# Patient Record
Sex: Male | Born: 1975 | Race: White | Hispanic: No | Marital: Married | Smoking: Never smoker
Health system: Southern US, Community
[De-identification: ages and names within clinical notes are randomized; demographics above are authoritative.]

## PROBLEM LIST (undated history)

## (undated) DIAGNOSIS — J45909 Unspecified asthma, uncomplicated: Secondary | ICD-10-CM

---

## 1999-10-31 ENCOUNTER — Ambulatory Visit (HOSPITAL_COMMUNITY): Admission: RE | Admit: 1999-10-31 | Discharge: 1999-10-31 | Payer: Self-pay | Admitting: Specialist

## 1999-10-31 ENCOUNTER — Encounter: Payer: Self-pay | Admitting: Specialist

## 2003-10-25 ENCOUNTER — Emergency Department (HOSPITAL_COMMUNITY): Admission: EM | Admit: 2003-10-25 | Discharge: 2003-10-25 | Payer: Self-pay | Admitting: Emergency Medicine

## 2005-08-20 ENCOUNTER — Ambulatory Visit: Payer: Self-pay | Admitting: Gastroenterology

## 2005-09-06 ENCOUNTER — Ambulatory Visit: Payer: Self-pay | Admitting: Gastroenterology
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
## 2005-10-29 ENCOUNTER — Ambulatory Visit: Payer: Self-pay | Admitting: Gastroenterology

## 2010-05-29 ENCOUNTER — Encounter (INDEPENDENT_AMBULATORY_CARE_PROVIDER_SITE_OTHER): Payer: Self-pay | Admitting: *Deleted

## 2010-06-05 ENCOUNTER — Encounter (INDEPENDENT_AMBULATORY_CARE_PROVIDER_SITE_OTHER): Payer: Self-pay | Admitting: *Deleted

## 2010-06-16 ENCOUNTER — Encounter (INDEPENDENT_AMBULATORY_CARE_PROVIDER_SITE_OTHER): Payer: Self-pay | Admitting: *Deleted

## 2010-06-30 ENCOUNTER — Encounter (INDEPENDENT_AMBULATORY_CARE_PROVIDER_SITE_OTHER): Payer: Self-pay | Admitting: *Deleted

## 2010-07-05 ENCOUNTER — Ambulatory Visit: Payer: Self-pay | Admitting: Gastroenterology

## 2010-08-25 ENCOUNTER — Ambulatory Visit: Payer: Self-pay | Admitting: Gastroenterology

## 2010-10-03 NOTE — Letter (Signed)
Summary: Pre Visit Letter Revised  [facility]  [address] [place], [place] [address]   Phone: [phone]  Fax: [phone]        06/05/2010 MRN: [id number] [facility] [address] [place], [place]  [address]             Procedure Date:  07-19-10   Welcome to the Gastroenterology Division at [facility].    You are scheduled to see a nurse for your pre-procedure visit on 07-05-10 at 8:00a.m. on the 3rd floor at [facility], [address]  We ask that you try to arrive at our office 15 minutes prior to your appointment time to allow for check-in.  Please take a minute to review the attached form.  If you answer "Yes" to one or more of the questions on the first page, we ask that you call the person listed at your earliest opportunity.  If you answer "No" to all of the questions, please complete the rest of the form and bring it to your appointment.    Your nurse visit will consist of discussing your medical and surgical history, your immediate family medical history, and your medications.   If you are unable to list all of your medications on the form, please bring the medication bottles to your appointment and we will list them.  We will need to be aware of both prescribed and over the counter drugs.  We will need to know exact dosage information as well.    Please be prepared to read and sign documents such as consent forms, a financial agreement, and acknowledgement forms.  If necessary, and with your consent, a friend or relative is welcome to sit-in on the nurse visit with you.  Please bring your insurance card so that we may make a copy of it.  If your insurance requires a referral to see a specialist, please bring your referral form from your primary care physician.  No co-pay is required for this nurse visit.     If you cannot keep your appointment, please call [phone] to cancel or reschedule prior to your appointment date.  This  allows [place] the opportunity to schedule an appointment for another patient in need of care.    Thank you for choosing [facility] for your medical needs.  We appreciate the opportunity to care for you.  Please visit [place] at our website  to learn more about our practice.  Sincerely, The Gastroenterology Division

## 2010-10-03 NOTE — Letter (Signed)
Summary: New Patient letter  [facility]  [address] [place], [place] [address]   Phone: [phone]  Fax: [phone]       05/29/2010 MRN: [id number]  [facility] [address] [facility] [address] [place], [place]  [address]  Dear [name],  Welcome to the Gastroenterology Division at [facility].    You are scheduled to see Dr.  Christella Hartigan on 07-11-10 at 2:30p.m. on the 3rd floor at [facility], [address].  We ask that you try to arrive at our office 15 minutes prior to your appointment time to allow for check-in.  We would like you to complete the enclosed self-administered evaluation form prior to your visit and bring it with you on the day of your appointment.  We will review it with you.  Also, please bring a complete list of all your medications or, if you prefer, bring the medication bottles and we will list them.  Please bring your insurance card so that we may make a copy of it.  If your insurance requires a referral to see a specialist, please bring your referral form from your primary care physician.  Co-payments are due at the time of your visit and may be paid by cash, check or credit card.     Your office visit will consist of a consult with your physician (includes a physical exam), any laboratory testing he/she may order, scheduling of any necessary diagnostic testing (e.g. x-ray, ultrasound, CT-scan), and scheduling of a procedure (e.g. Endoscopy, Colonoscopy) if required.  Please allow enough time on your schedule to allow for any/all of these possibilities.    If you cannot keep your appointment, please call [phone] to cancel or reschedule prior to your appointment date.  This allows [place] the opportunity to schedule an appointment for another patient in need of care.  If you do not cancel or reschedule by 5 p.m. the business day prior to your appointment date, you will be charged a $50.00 late cancellation/no-show fee.    Thank you for  choosing [facility] for your medical needs.  We appreciate the opportunity to care for you.  Please visit [place] at our website  to learn more about our practice.                     Sincerely,                                                             The Gastroenterology Division

## 2010-10-03 NOTE — Miscellaneous (Signed)
Summary: [facility] PREVISIT/PREP  Clinical Lists Changes  Medications: Added new medication of MOVIPREP 100 GM  SOLR (PEG-KCL-NACL-NASULF-NA ASC-C) As per prep instructions. - Signed Rx of MOVIPREP 100 GM  SOLR (PEG-KCL-NACL-NASULF-NA ASC-C) As per prep instructions.;  #1 x 0;  Signed;  Entered by: Wyona Almas RN;  Authorized by: Rachael Fee MD;  Method used: Electronically to [facility] [address] [place], [place], [place]  [address], Ph: [phone], Fax: [phone] Observations: Added new observation of NKA: T (07/05/2010 8:03)    Prescriptions: MOVIPREP 100 GM  SOLR (PEG-KCL-NACL-NASULF-NA ASC-C) As per prep instructions.  #1 x 0   Entered by:   Wyona Almas RN   Authorized by:   Rachael Fee MD   Signed by:   Wyona Almas RN on 07/05/2010   Method used:   Electronically to        [facility]* (retail)       [address] [place], [place]  [address]       Ph: [phone]       Fax: [phone]   RxID:   [id number]

## 2010-10-03 NOTE — Letter (Signed)
Summary: Pre Visit Letter Revised  [facility]  [address] [place], [place] [address]   Phone: [phone]  Fax: [phone]        06/16/2010 MRN: [id number] Anthony Roth [address] [place], [place] [address]             Procedure Date:  07-19-10   Welcome to the Gastroenterology Division at [facility].    You are scheduled to see a nurse for your pre-procedure visit on 07-05-10 at 8:00a.m. on the 3rd floor at [facility], [address].  We ask that you try to arrive at our office 15 minutes prior to your appointment time to allow for check-in.  Please take a minute to review the attached form.  If you answer "Yes" to one or more of the questions on the first page, we ask that you call the person listed at your earliest opportunity.  If you answer "No" to all of the questions, please complete the rest of the form and bring it to your appointment.    Your nurse visit will consist of discussing your medical and surgical history, your immediate family medical history, and your medications.   If you are unable to list all of your medications on the form, please bring the medication bottles to your appointment and we will list them.  We will need to be aware of both prescribed and over the counter drugs.  We will need to know exact dosage information as well.    Please be prepared to read and sign documents such as consent forms, a financial agreement, and acknowledgement forms.  If necessary, and with your consent, a friend or relative is welcome to sit-in on the nurse visit with you.  Please bring your insurance card so that we may make a copy of it.  If your insurance requires a referral to see a specialist, please bring your referral form from your primary care physician.  No co-pay is required for this nurse visit.     If you cannot keep your appointment, please call [phone] to cancel or reschedule prior to your appointment date.  This  allows [place] the opportunity to schedule an appointment for another patient in need of care.    Thank you for choosing [facility] for your medical needs.  We appreciate the opportunity to care for you.  Please visit [place] at our website  to learn more about our practice.  Sincerely, The Gastroenterology Division

## 2010-10-03 NOTE — Letter (Signed)
Summary: Moviprep Instructions  [facility]  [address]   [place], [place] [address]   Phone: [phone]  Fax: [phone]       Anthony Roth    11-30-1975    MRN: [id number]        Procedure Day Dorna Bloom: Wednesday, 07-19-10     Arrival Time: 10:30 a.m.     Procedure Time: 11:30 a.m.     Location of Procedure:                    x  [facility] (4th Floor)   PREPARATION FOR COLONOSCOPY WITH MOVIPREP   Starting 5 days prior to your procedure 07-19-10 do not eat: nuts, seeds, popcorn, corn, beans, peas, salads, or any raw vegetables.  Do not take any fiber supplements (e.g. Metamucil, Citrucel, and Benefiber).  THE DAY BEFORE YOUR PROCEDURE         DATE: 07-18-10 DAY: Tuesday  1.  Drink clear liquids the entire day-NO SOLID FOOD  2.  Do not drink anything colored red or purple.  Avoid juices with pulp.  No orange juice.  3.  Drink at least 64 oz. (8 glasses) of fluid/clear liquids during the day to prevent dehydration and help the prep work efficiently.  CLEAR LIQUIDS INCLUDE: Water Jello Ice Popsicles Tea (sugar ok, no milk/cream) Powdered fruit flavored drinks Coffee (sugar ok, no milk/cream) Gatorade Juice: apple, white grape, white cranberry  Lemonade Clear bullion, consomm, broth Carbonated beverages (any kind) Strained chicken noodle soup Hard Candy                             4.  In the morning, mix first dose of MoviPrep solution:    Empty 1 Pouch A and 1 Pouch B into the disposable container    Add lukewarm drinking water to the top line of the container. Mix to dissolve    Refrigerate (mixed solution should be used within 24 hrs)  5.  Begin drinking the prep at 5:00 p.m. The MoviPrep container is divided by 4 marks.   Every 15 minutes drink the solution down to the next mark (approximately 8 oz) until the full liter is complete.   6.  Follow completed prep with 16 oz of clear liquid of your choice (Nothing red or  purple).  Continue to drink clear liquids until bedtime.  7.  Before going to bed, mix second dose of MoviPrep solution:    Empty 1 Pouch A and 1 Pouch B into the disposable container    Add lukewarm drinking water to the top line of the container. Mix to dissolve    Refrigerate  THE DAY OF YOUR PROCEDURE      DATE: 07-19-10 DAY: Wednesday  Beginning at 6:30 a.m. (5 hours before procedure):         1. Every 15 minutes, drink the solution down to the next mark (approx 8 oz) until the full liter is complete.  2. Follow completed prep with 16 oz. of clear liquid of your choice.    3. You may drink clear liquids until  9:30 a.m. (2 hours before procedure).   MEDICATION INSTRUCTIONS  Unless otherwise instructed, you should take regular prescription medications with a small sip of water   as early as possible the morning of your procedure.       OTHER INSTRUCTIONS  You will need a responsible adult at least 34 years of age  to accompany you and drive you home.   This person must remain in the waiting room during your procedure.  Wear loose fitting clothing that is easily removed.  Leave jewelry and other valuables at home.  However, you may wish to bring a book to read or  an iPod/MP3 player to listen to music as you wait for your procedure to start.  Remove all body piercing jewelry and leave at home.  Total time from sign-in until discharge is approximately 2-3 hours.  You should go home directly after your procedure and rest.  You can resume normal activities the  day after your procedure.  The day of your procedure you should not:   Drive   Make legal decisions   Operate machinery   Drink alcohol   Return to work  You will receive specific instructions about eating, activities and medications before you leave.    The above instructions have been reviewed and explained to me by   Wyona Almas RN  July 05, 2010 8:24 AM     I fully understand and can  verbalize these instructions _____________________________ Date _________

## 2010-10-05 NOTE — Procedures (Signed)
Summary: Colonoscopy  Patient: Anthony Roth Note: All result statuses are Final unless otherwise noted.  Tests: (1) Colonoscopy (COL)   COL Colonoscopy           DONE     [facility]     [address]     [place], [place]  [address]           COLONOSCOPY PROCEDURE REPORT           PATIENT:  Anthony Roth, Anthony Roth  MR#:  [id number]     BIRTHDATE:  April 10, 1976, 34 yrs. old  GENDER:  male     ENDOSCOPIST:  Rachael Fee, MD     PROCEDURE DATE:  08/25/2010     PROCEDURE:  Diagnostic Colonoscopy     ASA CLASS:  Class I     INDICATIONS:  Elevated Risk Screening, sibling with adenomatous     colon polyps in his 30's     MEDICATIONS:   Fentanyl 75 mcg IV, Versed 8 mg IV           DESCRIPTION OF PROCEDURE:   After the risks benefits and     alternatives of the procedure were thoroughly explained, informed     consent was obtained.  Digital rectal exam was performed and     revealed no rectal masses.   The LB [id number]-[id number] endoscope     was introduced through the anus and advanced to the cecum, which     was identified by both the appendix and ileocecal valve, without     limitations.  The quality of the prep was good, using MoviPrep.     The instrument was then slowly withdrawn as the colon was fully     examined.     <<PROCEDUREIMAGES>>     FINDINGS:  Mild diverticulosis was found in the sigmoid to     descending colon segments (see image4).  This was otherwise a     normal examination of the colon (see image1, image2, and image5).     Retroflexed views in the rectum revealed no abnormalities.    The     scope was then withdrawn from the patient and the procedure     completed.     COMPLICATIONS:  None           ENDOSCOPIC IMPRESSION:     1) Mild diverticulosis in the sigmoid to descending colon     segments     2) Otherwise normal examination; no polyps or cancers           RECOMMENDATIONS:     1) Given your significant family history of colon polyps, you  should have a repeat colonoscopy in 5 years           REPEAT EXAM:  5 years           ______________________________     Rachael Fee, MD           n.     eSIGNED:   Rachael Fee at 08/25/2010 11:08 AM           Milas Kocher, [id number]  Note: An exclamation mark (!) indicates a result that was not dispersed into the flowsheet. Document Creation Date: 08/25/2010 11:09 AM _______________________________________________________________________  (1) Order result status: Final Collection or observation date-time: 08/25/2010 11:05 Requested date-time:  Receipt date-time:  Reported date-time:  Referring Physician:   Ordering Physician: Rob Bunting [phone]) Specimen Source:  Source: Launa Grill Order Number: [id number] Lab site:  Appended Document: Colonoscopy    Clinical Lists Changes  Observations: Added new observation of COLONNXTDUE: 08/26/2015 (08/25/2010 11:46)

## 2011-12-10 ENCOUNTER — Ambulatory Visit (INDEPENDENT_AMBULATORY_CARE_PROVIDER_SITE_OTHER): Payer: BC Managed Care – PPO | Admitting: Physician Assistant

## 2011-12-10 VITALS — BP 96/61 | HR 96 | Temp 98.9°F | Resp 16 | Ht 69.5 in | Wt 165.0 lb

## 2011-12-10 DIAGNOSIS — R509 Fever, unspecified: Secondary | ICD-10-CM

## 2011-12-10 DIAGNOSIS — J111 Influenza due to unidentified influenza virus with other respiratory manifestations: Secondary | ICD-10-CM

## 2011-12-10 LAB — POCT INFLUENZA A/B
Influenza A, POC: NEGATIVE
Influenza B, POC: POSITIVE

## 2011-12-10 MED ORDER — IPRATROPIUM BROMIDE 0.06 % NA SOLN: 2.0000 | Freq: Four times a day (QID) | NASAL | Status: AC

## 2011-12-10 MED ORDER — HYDROCODONE-HOMATROPINE 5-1.5 MG/5ML PO SYRP: 5.0000 mL | ORAL_SOLUTION | ORAL | Status: AC | PRN

## 2011-12-10 MED ORDER — OSELTAMIVIR PHOSPHATE 75 MG PO CAPS: 75.0000 mg | ORAL_CAPSULE | Freq: Two times a day (BID) | ORAL | Status: AC

## 2011-12-10 NOTE — Patient Instructions (Signed)
Influenza Facts Flu (influenza) is a contagious respiratory illness caused by the influenza viruses. It can cause mild to severe illness. While most healthy people recover from the flu without specific treatment and without complications, older people, young children, and people with certain health conditions are at higher risk for serious complications from the flu, including death. CAUSES   The flu virus is spread from person to person by respiratory droplets from coughing and sneezing.   A person can also become infected by touching an object or surface with a virus on it and then touching their mouth, eye or nose.   Adults may be able to infect others from 1 day before symptoms occur and up to 7 days after getting sick. So it is possible to give someone the flu even before you know you are sick and continue to infect others while you are sick.  SYMPTOMS   Fever (usually high).   Headache.   Tiredness (can be extreme).   Cough.   Sore throat.   Runny or stuffy nose.   Body aches.   Diarrhea and vomiting may also occur, particularly in children.   These symptoms are referred to as "flu-like symptoms". A lot of different illnesses, including the common cold, can have similar symptoms.  DIAGNOSIS   There are tests that can determine if you have the flu as long you are tested within the first 2 or 3 days of illness.   A doctor's exam and additional tests may be needed to identify if you have a disease that is a complicating the flu.  RISKS AND COMPLICATIONS  Some of the complications caused by the flu include:  Bacterial pneumonia or progressive pneumonia caused by the flu virus.   Loss of body fluids (dehydration).   Worsening of chronic medical conditions, such as heart failure, asthma, or diabetes.   Sinus problems and ear infections.  HOME CARE INSTRUCTIONS   Seek medical care early on.   If you are at high risk from complications of the flu, consult your health-care  provider as soon as you develop flu-like symptoms. Those at high risk for complications include:   People 65 years or older.   People with chronic medical conditions, including diabetes.   Pregnant women.   Young children.   Your caregiver may recommend use of an antiviral medication to help treat the flu.   If you get the flu, get plenty of rest, drink a lot of liquids, and avoid using alcohol and tobacco.   You can take over-the-counter medications to relieve the symptoms of the flu if your caregiver approves. (Never give aspirin to children or teenagers who have flu-like symptoms, particularly fever).  PREVENTION  The single best way to prevent the flu is to get a flu vaccine each fall. Other measures that can help protect against the flu are:  Antiviral Medications   A number of antiviral drugs are approved for use in preventing the flu. These are prescription medications, and a doctor should be consulted before they are used.   Habits for Good Health   Cover your nose and mouth with a tissue when you cough or sneeze, throw the tissue away after you use it.   Wash your hands often with soap and water, especially after you cough or sneeze. If you are not near water, use an alcohol-based hand cleaner.   Avoid people who are sick.   If you get the flu, stay home from work or school. Avoid contact with   other people so that you do not make them sick, too.   Try not to touch your eyes, nose, or mouth as germs ore often spread this way.  IN CHILDREN, EMERGENCY WARNING SIGNS THAT NEED URGENT MEDICAL ATTENTION:  Fast breathing or trouble breathing.   Bluish skin color.   Not drinking enough fluids.   Not waking up or not interacting.   Being so irritable that the child does not want to be held.   Flu-like symptoms improve but then return with fever and worse cough.   Fever with a rash.  IN ADULTS, EMERGENCY WARNING SIGNS THAT NEED URGENT MEDICAL ATTENTION:  Difficulty  breathing or shortness of breath.   Pain or pressure in the chest or abdomen.   Sudden dizziness.   Confusion.   Severe or persistent vomiting.  SEEK IMMEDIATE MEDICAL CARE IF:  You or someone you know is experiencing any of the symptoms above. When you arrive at the emergency center,report that you think you have the flu. You may be asked to wear a mask and/or sit in a secluded area to protect others from getting sick. MAKE SURE YOU:   Understand these instructions.   Monitor your condition.   Seek medical care if you are getting worse, or not improving.  Document Released: 08/23/2003 Document Revised: 08/09/2011 Document Reviewed: 05/19/2009 Exit[employer] Patient Information 2012 [employer]. 

## 2011-12-10 NOTE — Progress Notes (Signed)
  Subjective:    Patient ID: Anthony Roth, male    DOB: 03-Oct-1975, 36 y.o.   MRN: [id number]  HPI Masato comes in tonight with his wife.  He is c/o scratchy throat and feeling like something was coming on 3 days ago but worsened last pm with myalgias, cough, congestion and PND.  Went to work today and still got worse with 103 fever.  No influenza vaccine. Non smoker.   Had sinus illness with high fever last month that ended with significant cough.   Review of Systems As above    Objective:   Physical Exam  Constitutional: He appears well-developed and well-nourished. He has a sickly appearance.  HENT:  Right Ear: Tympanic membrane normal.  Left Ear: Tympanic membrane normal.  Nose: Mucosal edema and rhinorrhea present.  Mouth/Throat: Posterior oropharyngeal erythema present.  Cardiovascular: Normal rate and regular rhythm.   Pulmonary/Chest: Effort normal and breath sounds normal.  Lymphadenopathy:    He has no cervical adenopathy.          Results for orders placed in visit on 12/10/11  POCT INFLUENZA A/B      Component Value Range   Influenza A, POC Negative     Influenza B, POC Positive      Assessment & Plan:  Influenza B  Tamiflu, Hycodan, Atrovent NS, mucinex D, push fluids, Advil every 6-8.  OOW thru 12/14/11.  Call if symptoms worsen.

## 2015-01-14 ENCOUNTER — Encounter: Payer: Self-pay | Admitting: Gastroenterology

## 2015-07-07 ENCOUNTER — Encounter: Payer: Self-pay | Admitting: Gastroenterology

## 2022-07-28 ENCOUNTER — Other Ambulatory Visit: Payer: Self-pay

## 2022-07-28 ENCOUNTER — Emergency Department (HOSPITAL_BASED_OUTPATIENT_CLINIC_OR_DEPARTMENT_OTHER)
Admission: EM | Admit: 2022-07-28 | Discharge: 2022-07-28 | Disposition: A | Discharge status: Home / Self Care | Payer: BC Managed Care – PPO | Attending: Emergency Medicine | Admitting: Emergency Medicine

## 2022-07-28 ENCOUNTER — Emergency Department (HOSPITAL_BASED_OUTPATIENT_CLINIC_OR_DEPARTMENT_OTHER): Payer: BC Managed Care – PPO

## 2022-07-28 ENCOUNTER — Encounter (HOSPITAL_BASED_OUTPATIENT_CLINIC_OR_DEPARTMENT_OTHER): Payer: Self-pay | Admitting: Emergency Medicine

## 2022-07-28 DIAGNOSIS — J45909 Unspecified asthma, uncomplicated: Secondary | ICD-10-CM | POA: Insufficient documentation

## 2022-07-28 DIAGNOSIS — R079 Chest pain, unspecified: Secondary | ICD-10-CM

## 2022-07-28 HISTORY — DX: Unspecified asthma, uncomplicated: J45.909

## 2022-07-28 LAB — CBC
HCT: 46.5 % (ref 39.0–52.0)
Hemoglobin: 15.8 g/dL (ref 13.0–17.0)
MCH: 31.7 pg (ref 26.0–34.0)
MCHC: 34 g/dL (ref 30.0–36.0)
MCV: 93.4 fL (ref 80.0–100.0)
Platelets: 275 10*3/uL (ref 150–400)
RBC: 4.98 MIL/uL (ref 4.22–5.81)
RDW: 12.7 % (ref 11.5–15.5)
WBC: 5.8 10*3/uL (ref 4.0–10.5)
nRBC: 0 % (ref 0.0–0.2)

## 2022-07-28 LAB — BASIC METABOLIC PANEL
Anion gap: 6 (ref 5–15)
BUN: 16 mg/dL (ref 6–20)
CO2: 27 mmol/L (ref 22–32)
Calcium: 8.8 mg/dL — ABNORMAL LOW (ref 8.9–10.3)
Chloride: 103 mmol/L (ref 98–111)
Creatinine, Ser: 1.05 mg/dL (ref 0.61–1.24)
GFR, Estimated: >60 mL/min (ref >60)
Glucose, Bld: 101 mg/dL — ABNORMAL HIGH (ref 70–99)
Potassium: 3.6 mmol/L (ref 3.5–5.1)
Sodium: 136 mmol/L (ref 135–145)

## 2022-07-28 LAB — D-DIMER, QUANTITATIVE: D-Dimer, Quant: <0.27 ug/mL-FEU (ref 0.00–0.50)

## 2022-07-28 LAB — TROPONIN I (HIGH SENSITIVITY)
Troponin I (High Sensitivity): <2 ng/L (ref <18)
Troponin I (High Sensitivity): <2 ng/L (ref <18)

## 2022-07-28 NOTE — ED Triage Notes (Signed)
Patient c/o mid chest pain, shortness of breath and palpitations for couple of weeks.

## 2022-07-28 NOTE — Discharge Instructions (Addendum)
It was a pleasure taking care of you today.  As discussed, all of your labs are reassuring.  Your cardiac markers were normal.  I have placed a referral to cardiology. They should call you within the next week to schedule an appointment. Return to the ER for new or worsening symptoms.

## 2022-07-28 NOTE — ED Provider Notes (Signed)
[facility] EMERGENCY DEPARTMENT Provider Note   CSN: [id number] Arrival date & time: 07/28/22  1419     History  Chief Complaint  Patient presents with   Chest Pain    Anthony Roth is a 46 y.o. male with a past medical history significant for asthma who presents to the ED due to intermittent central, nonradiating chest pain that has been present for the past 1 to 2 weeks associated with shortness of breath.  Patient describes chest pain as a pressure-like sensation in the center of his chest.  He admits to shortness of breath with exertion.  Chest pain occurs at rest and with exertion.  No previous cardiac history.  Patient has an extensive family history of cardiac issues some being before the age of 74.  Denies lower extremity edema.  No history of blood clots, recent surgeries, recent long immobilizations, or hormonal treatments.  Patient states he has been significantly stressed over the past few weeks.  No recent illness.  Denies fever and chills.  History obtained from patient and past medical records. No interpreter used during encounter.       Home Medications Prior to Admission medications   Medication Sig Start Date End Date Taking? Authorizing Provider  ipratropium (ATROVENT) 0.06 % nasal spray Place 2 sprays into the nose 4 (four) times daily. 12/10/11 12/09/12  Damaris Hippo, PA-C      Allergies    Patient has no known allergies.    Review of Systems   Review of Systems  Constitutional:  Negative for chills and fever.  Respiratory:  Positive for shortness of breath.   Cardiovascular:  Positive for chest pain. Negative for leg swelling.  Gastrointestinal:  Negative for abdominal pain, diarrhea, nausea and vomiting.  All other systems reviewed and are negative.   Physical Exam Updated Vital Signs BP 124/83   Pulse 65   Temp 99.1 F (37.3 C) (Oral)   Resp 16   Ht 5\' 10"  (1.778 m)   Wt 97.5 kg   SpO2 96%   BMI 30.85 kg/m  Physical  Exam Vitals and nursing note reviewed.  Constitutional:      General: He is not in acute distress.    Appearance: He is not ill-appearing.  HENT:     Head: Normocephalic.  Eyes:     Pupils: Pupils are equal, round, and reactive to light.  Cardiovascular:     Rate and Rhythm: Normal rate and regular rhythm.     Pulses: Normal pulses.     Heart sounds: Normal heart sounds. No murmur heard.    No friction rub. No gallop.  Pulmonary:     Effort: Pulmonary effort is normal.     Breath sounds: Normal breath sounds.  Abdominal:     General: Abdomen is flat. There is no distension.     Palpations: Abdomen is soft.     Tenderness: There is no abdominal tenderness. There is no guarding or rebound.  Musculoskeletal:        General: Normal range of motion.     Cervical back: Neck supple.     Comments: No lower extremity edema.  Skin:    General: Skin is warm and dry.  Neurological:     General: No focal deficit present.     Mental Status: He is alert.  Psychiatric:        Mood and Affect: Mood normal.        Behavior: Behavior normal.     ED  Results / Procedures / Treatments   Labs (all labs ordered are listed, but only abnormal results are displayed) Labs Reviewed  BASIC METABOLIC PANEL - Abnormal; Notable for the following components:      Result Value   Glucose, Bld 101 (*)    Calcium 8.8 (*)    All other components within normal limits  CBC  D-DIMER, QUANTITATIVE  TROPONIN I (HIGH SENSITIVITY)  TROPONIN I (HIGH SENSITIVITY)    EKG None  Radiology No results found.  Procedures Procedures    Medications Ordered in ED Medications - No data to display  ED Course/ Medical Decision Making/ A&P Clinical Course as of 07/28/22 2257  Sat Jul 28, 2022  1837 D-Dimer, Quant: <0.27 [CA]    Clinical Course User Index [CA] Mannie Stabile, PA-C                           Medical Decision Making Amount and/or Complexity of Data Reviewed Independent Historian:  spouse Labs: ordered. Decision-making details documented in ED Course. Radiology: ordered and independent interpretation performed. Decision-making details documented in ED Course. ECG/medicine tests: ordered and independent interpretation performed. Decision-making details documented in ED Course.   This patient presents to the ED for concern of CP, this involves an extensive number of treatment options, and is a complaint that carries with it a high risk of complications and morbidity.  The differential diagnosis includes ACS, PE, dissection, pneumonia, MSK, etc  46 year old male presents to the ED due to intermittent central nonradiating chest pain associate with shortness of breath for the past 1 to 2 weeks.  No cardiac history.  Patient has an extensive family history of cardiac issues.  No history of blood clots.  Upon arrival, stable vitals.  Patient in no acute distress.  Benign physical exam.  No lower extremity edema.  Cardiac labs ordered.  EKG and chest x-ray.  Added D-dimer to rule out PE given shortness of breath.  CBC unremarkable.  No leukocytosis and normal hemoglobin.  Delta troponin flat.  EKG demonstrates normal sinus rhythm.  No signs of acute ischemia.  Low suspicion for ACS.  Dimer normal.  Low suspicion for PE/DVT.  BMP reassuring.  Normal renal function.  No major electrolyte derangements.  Chest x-ray personally reviewed and interpreted which are negative for signs of pneumonia, pneumothorax or widened mediastinum.  Presentation not concerning for dissection.  No infectious symptoms to suggest myocarditis or other noxious etiology.    Upon reassessment, patient remained with very minimal chest pain. No worsening of symptoms while here in the ED. Cardiology referral placed for patient. Strict ED precautions discussed with patient. Patient states understanding and agrees to plan. Patient discharged home in no acute distress and stable vitals.       Final Clinical  Impression(s) / ED Diagnoses Final diagnoses:  Nonspecific chest pain    Rx / DC Orders ED Discharge Orders          Ordered    Ambulatory referral to Cardiology       Comments: If you have not heard from the Cardiology office within the next 72 hours please call [phone].   07/28/22 2250              Mannie Stabile, PA-C 07/28/22 2258    Elayne Snare K, DO 07/28/22 2322

## 2022-08-15 ENCOUNTER — Other Ambulatory Visit (HOSPITAL_COMMUNITY): Payer: Self-pay | Admitting: *Deleted

## 2022-08-15 DIAGNOSIS — R079 Chest pain, unspecified: Secondary | ICD-10-CM

## 2022-08-23 ENCOUNTER — Telehealth (HOSPITAL_COMMUNITY): Payer: Self-pay | Admitting: *Deleted

## 2022-08-23 NOTE — Telephone Encounter (Signed)
No DPR on file  Left generic message for pt to call back. 

## 2022-08-23 NOTE — Telephone Encounter (Signed)
Spoke with pt and gave instructions for stress echo scheduled on 08/30/22.

## 2022-08-30 ENCOUNTER — Encounter (HOSPITAL_COMMUNITY): Payer: Self-pay

## 2022-08-30 ENCOUNTER — Other Ambulatory Visit (HOSPITAL_COMMUNITY): Payer: BC Managed Care – PPO

## 2022-09-21 ENCOUNTER — Encounter (HOSPITAL_COMMUNITY): Payer: Self-pay

## 2022-09-21 ENCOUNTER — Ambulatory Visit (HOSPITAL_COMMUNITY): Payer: BC Managed Care – PPO | Attending: *Deleted

## 2022-09-21 ENCOUNTER — Other Ambulatory Visit (HOSPITAL_COMMUNITY): Payer: BC Managed Care – PPO

## 2022-09-21 DIAGNOSIS — R079 Chest pain, unspecified: Secondary | ICD-10-CM

## 2022-09-21 LAB — ECHOCARDIOGRAM STRESS TEST
Area-P 1/2: 3.42 cm2
S' Lateral: 2.4 cm

## 2022-09-21 MED ORDER — PERFLUTREN LIPID MICROSPHERE
5.0000 mL | INTRAVENOUS | Status: AC | PRN
  Administered 2022-09-21: 5 mL via INTRAVENOUS

## 2022-09-24 MED FILL — Perflutren Lipid Microsphere IV Susp 1.1 MG/ML: INTRAVENOUS | Qty: 10 | Status: AC

## 2023-12-20 ENCOUNTER — Ambulatory Visit
Admission: RE | Admit: 2023-12-20 | Discharge: 2023-12-20 | Disposition: A | Discharge status: Home / Self Care | Source: Ambulatory Visit | Attending: *Deleted | Admitting: *Deleted

## 2023-12-20 ENCOUNTER — Other Ambulatory Visit: Payer: Self-pay | Admitting: *Deleted

## 2023-12-20 DIAGNOSIS — M79601 Pain in right arm: Secondary | ICD-10-CM
